# Patient Record
Sex: Male | Born: 1995 | Race: White | Hispanic: No | Marital: Single | Smoking: Current some day smoker
Health system: Southern US, Community
[De-identification: ages and names within clinical notes are randomized; demographics above are authoritative.]

---
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
## 2018-03-09 ENCOUNTER — Emergency Department (HOSPITAL_COMMUNITY)
Admission: EM | Admit: 2018-03-09 | Discharge: 2018-03-09 | Disposition: A | Discharge status: Home / Self Care | Payer: 59 | Attending: Emergency Medicine | Admitting: Emergency Medicine

## 2018-03-09 ENCOUNTER — Other Ambulatory Visit: Payer: Self-pay

## 2018-03-09 ENCOUNTER — Encounter (HOSPITAL_COMMUNITY): Payer: Self-pay | Admitting: Emergency Medicine

## 2018-03-09 DIAGNOSIS — Z23 Encounter for immunization: Secondary | ICD-10-CM | POA: Diagnosis not present

## 2018-03-09 DIAGNOSIS — W450XXA Nail entering through skin, initial encounter: Secondary | ICD-10-CM | POA: Diagnosis not present

## 2018-03-09 DIAGNOSIS — R0602 Shortness of breath: Secondary | ICD-10-CM | POA: Diagnosis not present

## 2018-03-09 DIAGNOSIS — F17200 Nicotine dependence, unspecified, uncomplicated: Secondary | ICD-10-CM | POA: Diagnosis not present

## 2018-03-09 DIAGNOSIS — Y9289 Other specified places as the place of occurrence of the external cause: Secondary | ICD-10-CM | POA: Diagnosis not present

## 2018-03-09 DIAGNOSIS — R253 Fasciculation: Secondary | ICD-10-CM

## 2018-03-09 DIAGNOSIS — T148XXA Other injury of unspecified body region, initial encounter: Secondary | ICD-10-CM

## 2018-03-09 DIAGNOSIS — S91331A Puncture wound without foreign body, right foot, initial encounter: Secondary | ICD-10-CM | POA: Insufficient documentation

## 2018-03-09 DIAGNOSIS — Y999 Unspecified external cause status: Secondary | ICD-10-CM | POA: Insufficient documentation

## 2018-03-09 DIAGNOSIS — R252 Cramp and spasm: Secondary | ICD-10-CM | POA: Insufficient documentation

## 2018-03-09 DIAGNOSIS — Y9389 Activity, other specified: Secondary | ICD-10-CM | POA: Diagnosis not present

## 2018-03-09 DIAGNOSIS — S99921A Unspecified injury of right foot, initial encounter: Secondary | ICD-10-CM | POA: Diagnosis present

## 2018-03-09 MED ORDER — TETANUS-DIPHTH-ACELL PERTUSSIS 5-2.5-18.5 LF-MCG/0.5 IM SUSP
0.5000 mL | Freq: Once | INTRAMUSCULAR | Status: AC
Start: 2018-03-09 — End: 2018-03-09
  Administered 2018-03-09: 0.5 mL via INTRAMUSCULAR
  Filled 2018-03-09: qty 0.5

## 2018-03-09 MED ORDER — BACITRACIN ZINC 500 UNIT/GM EX OINT: 1.0000 "application " | TOPICAL_OINTMENT | Freq: Two times a day (BID) | CUTANEOUS | 0 refills | Status: AC

## 2018-03-09 NOTE — ED Notes (Signed)
PT reports he stepped on a rusty nail yesterday, very minimal and superficial spot noted to bottom of R foot. Area is scabbed over. Pt does not know when last tetanus shot was, received vaccinations prior to going to college and it could have been one of those.

## 2018-03-09 NOTE — ED Provider Notes (Signed)
[facility] EMERGENCY DEPARTMENT Provider Note   CSN: [id number] Arrival date & time: 03/09/18  1918     History   Chief Complaint Chief Complaint  Patient presents with  . Leg Pain    HPI Cody May is a 22 y.o. male with no significant past medical history presents today for evaluation of acute onset of wound to the plantar aspect of the right foot.  Patient states that yesterday at around 11 PM he was outside on a deck when he stepped on a portion of the deck that had a rusty nail exposed.  He states that the nail went through his shoe and resulted in a superficial injury to the plantar aspect of his right foot.  He denies any active bleeding.  He states that since then he has felt intermittent "twitching" of his upper and lower extremities.  He states that he felt that it was a little bit difficult to talk today and that he could not move his jaw as much as normal.  Denies drooling.  Does note intermittent shortness of breath but is unsure of when this comes about.  Also felt as though his heart rate was elevated sometimes.  He states that he "did some Googling" and is concerned for tetanus.  Unsure when his last tetanus vaccination was.  Denies fevers, chills, or chest pain.  Mother notes that they did spend all day yesterday moving, spending time outside and carrying heavy objects and that the patient may be somewhat dehydrated.  The history is provided by the patient and a parent.    History reviewed. No pertinent past medical history.  There are no active problems to display for this patient.   History reviewed. No pertinent surgical history.      Home Medications    Prior to Admission medications   Medication Sig Start Date End Date Taking? Authorizing Provider  bacitracin ointment Apply 1 application topically 2 (two) times daily. 03/09/18   Jeanie Sewer, PA-C    Family History No family history on file.  Social History Social History   Tobacco Use  .  Smoking status: Current Some Day Smoker  . Smokeless tobacco: Never Used  Substance Use Topics  . Alcohol use: Yes  . Drug use: Yes    Types: Marijuana     Allergies   Patient has no allergy information on record.   Review of Systems Review of Systems  Constitutional: Negative for fever.  HENT: Negative for drooling and sore throat.   Respiratory: Positive for shortness of breath.   Cardiovascular: Negative for chest pain.  Musculoskeletal: Positive for myalgias. Negative for neck pain and neck stiffness.  Skin: Positive for wound.  Neurological: Negative for syncope, weakness and numbness.  All other systems reviewed and are negative.    Physical Exam Updated Vital Signs BP 140/63 (BP Location: Right Arm)   Pulse (!) 111   Temp 98.4 F (36.9 C) (Oral)   Resp 16   Ht 5\' 9"  (1.753 m)   Wt 68 kg (150 lb)   SpO2 98%   BMI 22.15 kg/m   Physical Exam  Constitutional: He is oriented to person, place, and time. He appears well-developed and well-nourished. No distress.  HENT:  Head: Normocephalic and atraumatic.  Tolerating secretions without difficulty.  No trismus or sublingual abnormalities.  No tonsillar hypertrophy, exudates, or uvular deviation.  No upper airway stridor.  Eyes: Conjunctivae are normal. Right eye exhibits no discharge. Left eye exhibits no discharge.  Neck:  Normal range of motion. Neck supple. No JVD present. No tracheal deviation present.  No difficulty swallowing.  Cardiovascular: Normal rate.  Pulmonary/Chest: Effort normal.  Abdominal: He exhibits no distension.  Musculoskeletal: Normal range of motion. He exhibits no edema or tenderness.  No midline spine TTP, no paraspinal muscle tenderness, no deformity, crepitus, or step-off noted. moves extremities spontaneously with normal range of motion.  5/5 strength of BUE and BLE major muscle groups.  Lymphadenopathy:    He has no cervical adenopathy.  Neurological: He is alert and oriented to  person, place, and time. No cranial nerve deficit or sensory deficit. He exhibits normal muscle tone.  Mental Status:  Alert, thought content appropriate, able to give a coherent history. Speech fluent without evidence of aphasia. Able to follow 2 step commands without difficulty.  Cranial Nerves:  II:  Peripheral visual fields grossly normal, pupils equal, round, reactive to light III,IV, VI: ptosis not present, extra-ocular motions intact bilaterally  V,VII: smile symmetric, facial light touch sensation equal VIII: hearing grossly normal to voice  X: uvula elevates symmetrically  XI: bilateral shoulder shrug symmetric and strong XII: midline tongue extension without fassiculations Motor:  Normal tone. 5/5 strength of BUE and BLE major muscle groups including strong and equal grip strength and dorsiflexion/plantar flexion Sensory: light touch normal in all extremities. Gait: normal gait and balance. Able to walk on toes and heels with ease.     Skin: Skin is warm and dry. No erythema.  Pinpoint scab <28mm to the plantar aspect of the right foot.  Nontender to palpation.  No surrounding erythema, induration, or fluctuance.  Psychiatric: He has a normal mood and affect. His behavior is normal.  Nursing note and vitals reviewed.    ED Treatments / Results  Labs (all labs ordered are listed, but only abnormal results are displayed) Labs Reviewed - No data to display  EKG None  Radiology No results found.  Procedures Procedures (including critical care time)  Medications Ordered in ED Medications  Tdap (BOOSTRIX) injection 0.5 mL (has no administration in time range)     Initial Impression / Assessment and Plan / ED Course  I have reviewed the triage vital signs and the nursing notes.  Pertinent labs & imaging results that were available during my care of the patient were reviewed by me and considered in my medical decision making (see chart for details).     Patient  presents for evaluation of puncture wound to the plantar aspect of the right foot which occurred yesterday at around 11 PM.  He is afebrile, initially somewhat tachycardic but with resolution on reevaluation.  He does state that he felt quite anxious at the thought that he could possibly have tetanus.  I splinted patient of the likelihood of him developing tetanus less than 24 hours after exposure to a rusty nail is highly unlikely as the incubation period is typically as little as 3 days or as long as 21 days, average 8 days.  He has no trismus, tolerating secretions without difficulty.  No evidence of flaccid or spastic paralysis.  He is neurovascularly intact, ambulatory without difficulty.  His wound appears to be quite superficial, no signs of secondary skin infection.  He did also spend the day yesterday moving to a new home, possibly mildly dehydrated.  I doubt gross electrolyte abnormalities.  We will update his tetanus as he is unsure of the last time he received his tetanus vaccine.  Discussed wound care.  Discussed strict ED  return precautions.  Patient and patient's mother verbalized understanding of and agreement with plan and patient is stable for discharge home at this time.  Final Clinical Impressions(s) / ED Diagnoses   Final diagnoses:  Puncture wound  Muscle twitching    ED Discharge Orders        Ordered    bacitracin ointment  2 times daily     03/09/18 2052       Bennye AlmFawze, Celeste Candelas A, PA-C 03/09/18 2135    Raeford RazorKohut, Stephen, MD 03/09/18 2358

## 2018-03-09 NOTE — ED Triage Notes (Signed)
Pt sent by pcp to rule out tetanus. Pt stepped on rusty nail yesterday and now c/o bilateral leg spasms and states it feels hard to swallow at times.

## 2018-03-09 NOTE — Discharge Instructions (Signed)
Apply antibiotic ointment twice daily. Follow-up with PCP if symptoms persist. Return to the emergency department immediately for any concerning signs or symptoms develop such as high fevers, redness of the extremities or swelling, lockjaw, or muscle spasms.

## 2018-08-18 DIAGNOSIS — R209 Unspecified disturbances of skin sensation: Secondary | ICD-10-CM | POA: Diagnosis not present

## 2018-09-15 ENCOUNTER — Other Ambulatory Visit: Payer: Self-pay | Admitting: Internal Medicine

## 2018-09-15 DIAGNOSIS — R200 Anesthesia of skin: Secondary | ICD-10-CM

## 2018-09-15 DIAGNOSIS — Z1389 Encounter for screening for other disorder: Secondary | ICD-10-CM | POA: Diagnosis not present

## 2018-09-15 DIAGNOSIS — R209 Unspecified disturbances of skin sensation: Secondary | ICD-10-CM | POA: Diagnosis not present

## 2018-09-15 DIAGNOSIS — J302 Other seasonal allergic rhinitis: Secondary | ICD-10-CM | POA: Diagnosis not present

## 2018-09-15 DIAGNOSIS — G43909 Migraine, unspecified, not intractable, without status migrainosus: Secondary | ICD-10-CM | POA: Diagnosis not present

## 2018-09-19 ENCOUNTER — Ambulatory Visit
Admission: RE | Admit: 2018-09-19 | Discharge: 2018-09-19 | Disposition: A | Discharge status: Home / Self Care | Payer: BLUE CROSS/BLUE SHIELD | Source: Ambulatory Visit | Attending: Internal Medicine | Admitting: Internal Medicine

## 2018-09-19 DIAGNOSIS — R200 Anesthesia of skin: Secondary | ICD-10-CM

## 2018-12-12 DIAGNOSIS — R0789 Other chest pain: Secondary | ICD-10-CM | POA: Diagnosis not present

## 2018-12-12 DIAGNOSIS — K219 Gastro-esophageal reflux disease without esophagitis: Secondary | ICD-10-CM | POA: Diagnosis not present

## 2018-12-12 DIAGNOSIS — M545 Low back pain: Secondary | ICD-10-CM | POA: Diagnosis not present

## 2020-08-23 IMAGING — CT CT HEAD W/O CM
3 of 4 series · 16 of 47 positions shown, 19 images · non-contrast
Comparison: None.

CLINICAL DATA: Headache with right-sided numbness for several weeks

EXAM:
CT HEAD WITHOUT CONTRAST
TECHNIQUE: Contiguous axial images were obtained from the base of the skull
through the vertex without intravenous contrast.

[Series 2: head 5.00 hr40 s3 ibhc · axial · 0.47mm/px · z∈[-621,-471]mm · 10 of 36 slices shown, 13 images]
[im 3/36  brain]
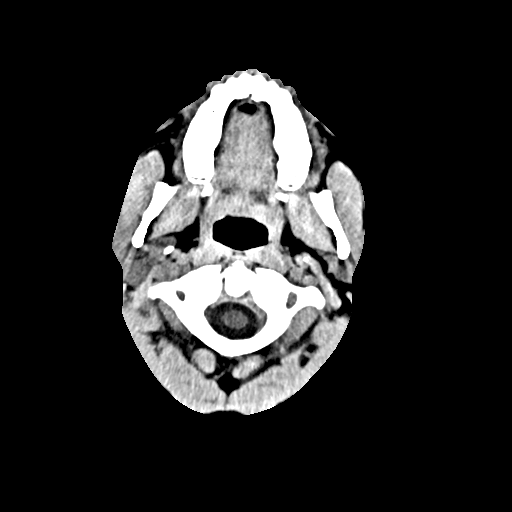
[im 3/36  bone]
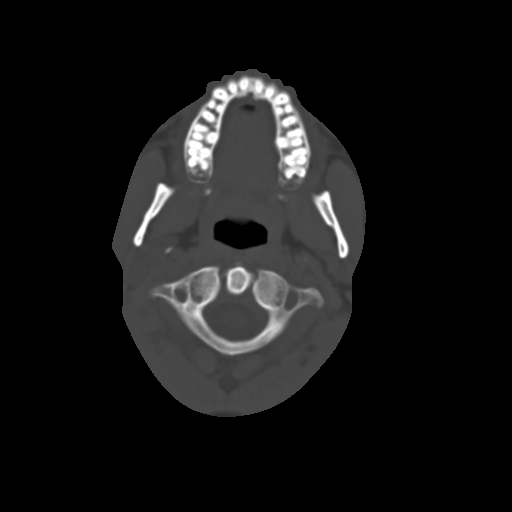
[im 6/36  brain]
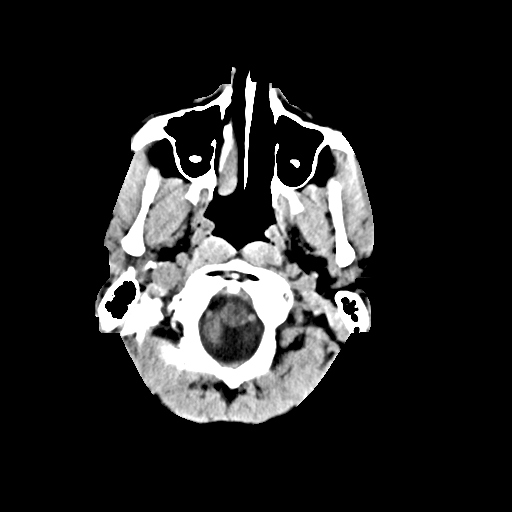
[im 11/36  brain]
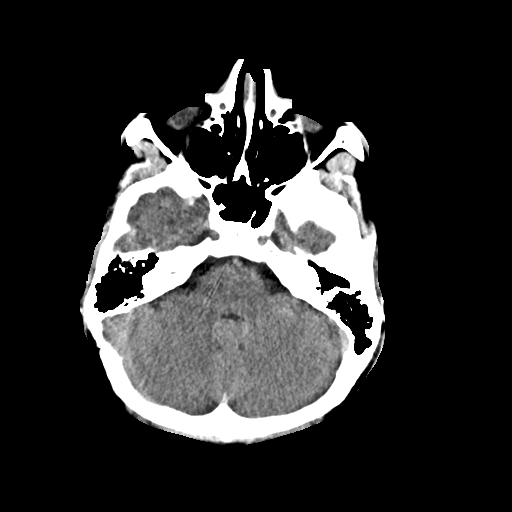
[im 13/36  brain]
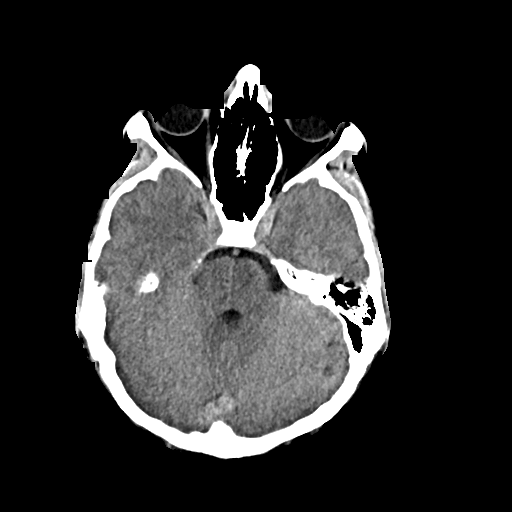
[im 16/36  brain]
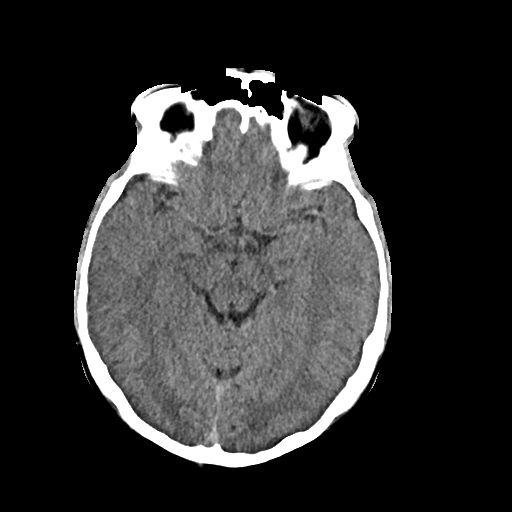
[im 16/36  bone]
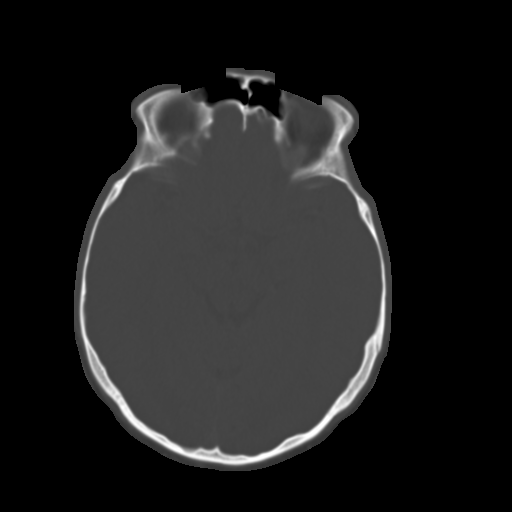
[im 21/36  brain]
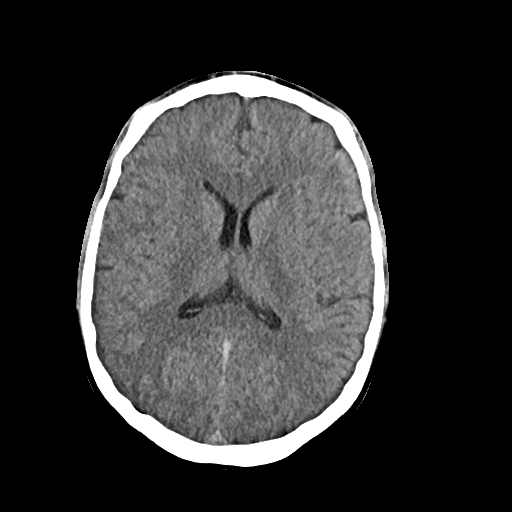
[im 23/36  brain]
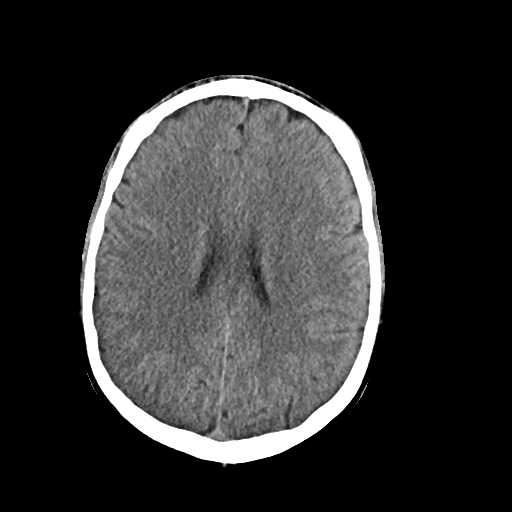
[im 26/36  brain]
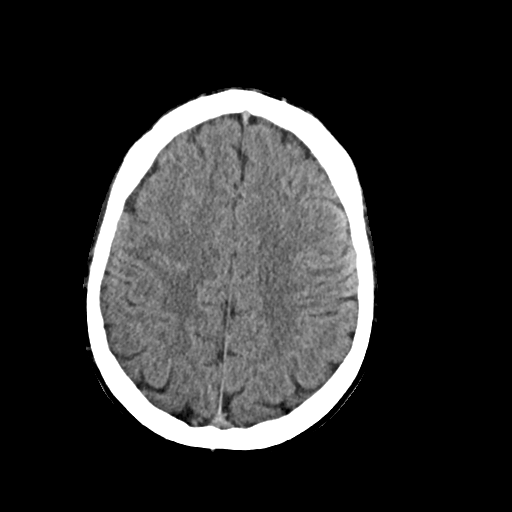
[im 31/36  brain]
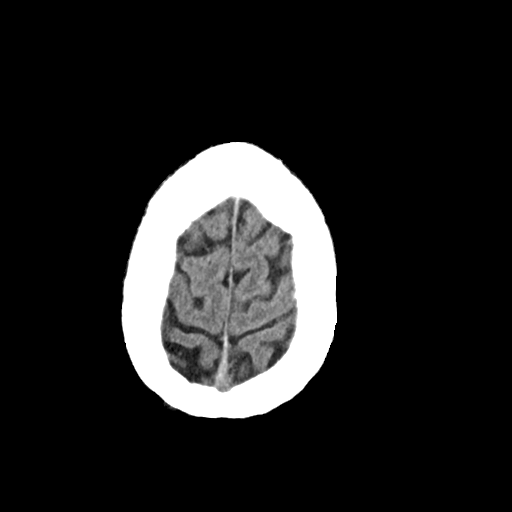
[im 31/36  bone]
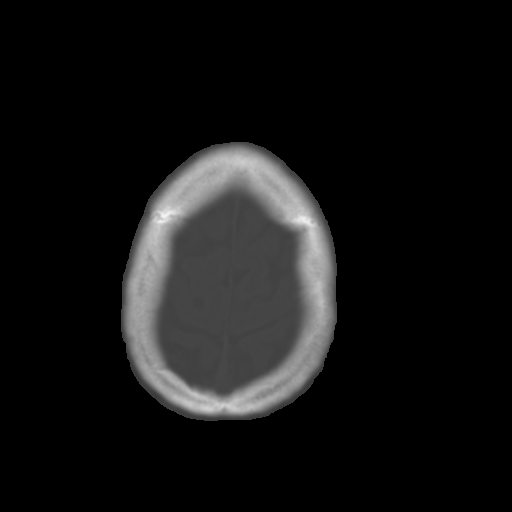
[im 33/36  brain]
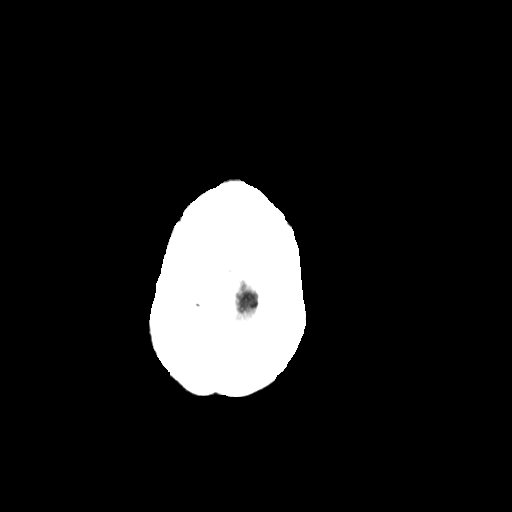

[Series 4: head 3.00 hr40 s3 sag · sagittal · 0.37mm/px · 3 of 59 slices shown]
[im 20/59  brain]
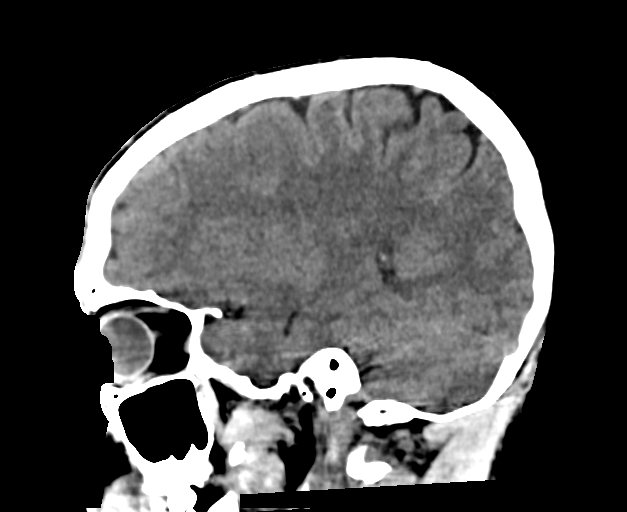
[im 30/59  brain]
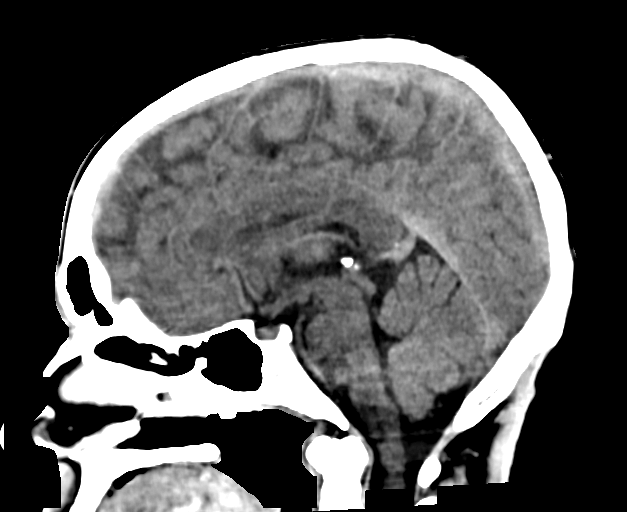
[im 39/59  brain]
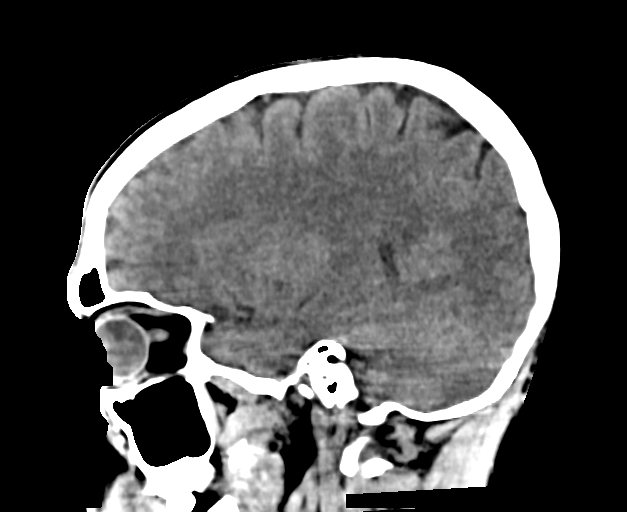

[Series 6: head 3.00 hr40 s3 cor · coronal · 0.34mm/px · 3 of 76 slices shown]
[im 26/76  brain]
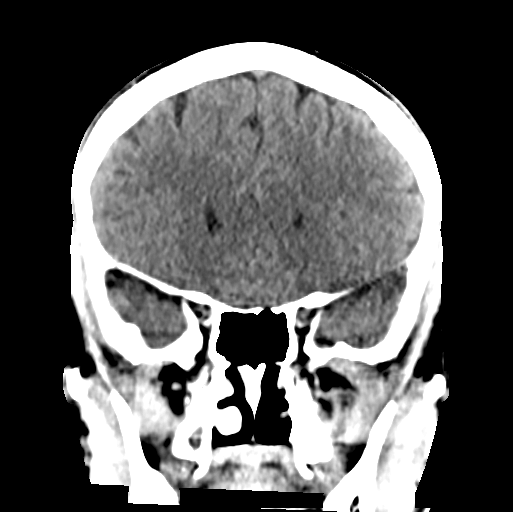
[im 34/76  brain]
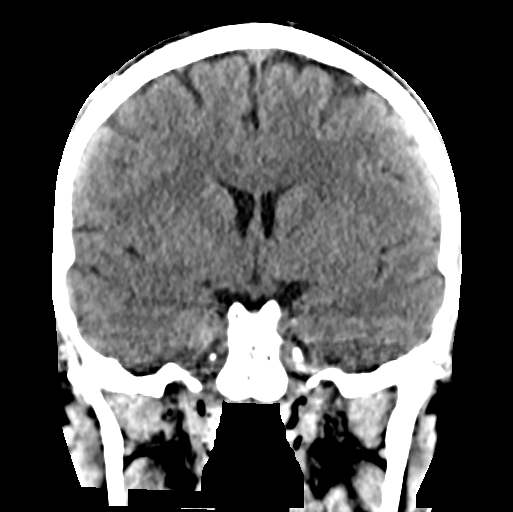
[im 42/76  brain]
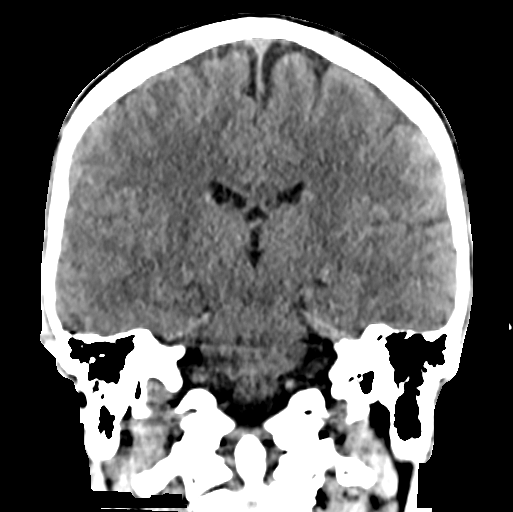

[16 of 47 positions shown; findings below may reference images not displayed]

FINDINGS: Brain: The ventricles are normal in size and configuration. There is
no intracranial mass, hemorrhage, extra-axial fluid collection, or
midline shift. Brain parenchyma appears normal. No acute infarct
evident.

Vascular: No hyperdense vessel. No appreciable vascular
calcification evident.

Skull: The bony calvarium appears intact.

Sinuses/Orbits: Paranasal sinuses are clear. Orbits appear symmetric
bilaterally.

Other: Mastoid air cells are clear. There is debris in each external
auditory canal.
IMPRESSION: Probable cerumen in each external auditory canal. Study otherwise
unremarkable.

## 2024-09-26 NOTE — Progress Notes (Signed)
 Subjective  Previsit planning was completed via snapshot and review of chart.   Cody May is a 29 y.o. male who presents for Ear Problem (Pt states that he is having trouble hearing out of his left ear, started about 3 hours ago, slight trouble for about the past week. Put ear wax removal drops in and used a qtip to attempt to remove ear wax )   History of Present Illness This is a 29 year old male presenting with left ear hearing loss.  He has been experiencing mild tinnitus in his left ear for the past week, which he attributes to his noisy work environment. Earlier today, he noticed a significant decrease in his hearing ability in the same ear which has worsened since he tried over-the-counter ear drops and Q-tips for treatment.Cody May He reports no ear drainage, sore throat, fever, or body aches.  No cough, dizziness, respiratory symptoms.  He also does not experience any pain but mentions a slight sensation of pressure in the affected ear. This is his first encounter with such symptoms. He has not attempted any self-treatment with ear drops and does not use Q-tips for ear cleaning.  Medical History[1]  Allergies[2]   Surgical History[3]  Family History[4]  Social History   Socioeconomic History   Marital status: Single    Spouse name: Not on file   Number of children: Not on file   Years of education: Not on file   Highest education level: Not on file  Occupational History   Not on file  Tobacco Use   Smoking status: Never   Smokeless tobacco: Never  Vaping Use   Vaping status: Every Day   Substances: Nicotine, THC, Flavoring  Substance and Sexual Activity   Alcohol use: Not on file   Drug use: Never   Sexual activity: Not on file  Other Topics Concern   Not on file  Social History Narrative   Not on file   Social Drivers of Health   Living Situation: Not on file  Food Insecurity: Not on file  Transportation Needs: Not on file  Utilities: Not  on file  Safety: Not on file  Alcohol Screening: Not on file  Tobacco Use: Low Risk (09/26/2024)   Patient History    Smoking Tobacco Use: Never    Smokeless Tobacco Use: Never    Passive Exposure: Not on file  Depression: Not At Risk (09/26/2024)   PHQ-2    PHQ-2 Score: 2  Social Connections: Not on file  Financial Resource Strain: Not on file     Encounter Medications[5]   Review of Systems - All systems reviewed and are negative except what is noted in the HPI.  Objective  Blood pressure 151/78, pulse 67, temperature 96.2 F (35.7 C), temperature source Tympanic, resp. rate 18, height 1.753 m (5' 9), weight 77.1 kg (170 lb), SpO2 99%. Physical Exam Chaperone: Vernell ENT: Impacted wax in the left ear canal, obstructing the view of the eardrum. Tongue appears normal. Physical Exam Vitals and nursing note reviewed.  Constitutional:      General: He is not in acute distress.    Appearance: Normal appearance. He is not ill-appearing or toxic-appearing.  HENT:     Head: Normocephalic and atraumatic.     Right Ear: Tympanic membrane, ear canal and external ear normal. There is no impacted cerumen.     Left Ear: External ear normal. There is impacted cerumen.     Nose: Nose normal. No congestion or rhinorrhea.  Mouth/Throat:     Mouth: Mucous membranes are moist.     Pharynx: Oropharynx is clear.  Eyes:     Conjunctiva/sclera: Conjunctivae normal.     Pupils: Pupils are equal, round, and reactive to light.  Cardiovascular:     Rate and Rhythm: Normal rate and regular rhythm.     Pulses: Normal pulses.     Heart sounds: Normal heart sounds.  Pulmonary:     Effort: Pulmonary effort is normal. No respiratory distress.  Abdominal:     General: There is no distension.     Palpations: Abdomen is soft.  Musculoskeletal:        General: Normal range of motion.     Cervical back: Normal range of motion and neck supple. No tenderness.  Lymphadenopathy:     Cervical: No  cervical adenopathy.  Skin:    General: Skin is warm and dry.     Capillary Refill: Capillary refill takes less than 2 seconds.     Findings: No rash.  Neurological:     General: No focal deficit present.     Mental Status: He is alert and oriented to person, place, and time.  Psychiatric:        Mood and Affect: Mood normal.        Behavior: Behavior normal.     Results  No results found for this or any previous visit (from the past 24 hours).  Imaging result: NA If imaging obtained during visit, radiologist will also review.  No orders to display     Assessment/Plan   1. Impacted cerumen, left ear  Ear Cerumen Removal    2. Hearing loss of left ear due to cerumen impaction  Ear Cerumen Removal      Assessment & Plan Initial Assessment: 29 year old male with slight tinnitus and hearing loss in the left ear for the past week. No associated pain, drainage, sore throat, fever, or body aches. Examination revealed impacted wax against the eardrum.  Differential Diagnosis: - Impacted earwax: Examination revealed impacted wax against the eardrum. Attempt to remove wax. If unsuccessful, prescribe softening drops. Differentials also include, but are not limited to foreign body in the ear canal, otitis media, serous otitis media, cerumen impaction, otitis externa, neurologic hearing loss.  Urgent Care Course: - Examination revealed impacted wax against the left eardrum. Cerumen removal ordered  Final Assessment: Cerumen impaction left ear with acute loss of hearing since adding eardrops and using a Q-tip earlier today  Clinical Impression: - Impacted earwax with successful disimpaction and restoration of hearing  Disposition: - Follow-Up: Return if symptoms recur  Patient Education: Cerumen impaction information given    Medical records from outside the [facility] system also reviewed during today's clinical visit.    Findings and plan of care discussed with the  patient/family members.  Lab study results, if obtained today, discussed with patient/family.   Written and verbal instructions given for pt's diagnosis/es as well as recommendations for symptom relief and follow up. If pt is having pain and no contraindications, advised taking OTC acetaminophen and/or ibuprofen as needed.  Increase fluid intake if directed for hydration. I reviewed the patient instructions on the AVS with the patient/family who convey/s understanding and is/are in agreement with plan of care. The patient/family voice/s understanding of all prescriptions, if written. No barriers to adherence were noted and they convey understanding.  Plan for follow-up as discussed or as needed if any worsening symptoms or change in condition. If chronic conditions noted, the pt  is advised to follow up with their PCP. Strict ED precautions given and discussed. All Questions are answered and the patient/family conveys understanding.  After Visit Summary was given to patient and/or sent to MyChart.   Cody May   [employer] was used to create visit note. Consent from the patient/caregiver was obtained prior to its use.        [1] History reviewed. No pertinent past medical history. [2] No Known Allergies [3] History reviewed. No pertinent surgical history. [4] No family history on file. [5] No outpatient encounter medications on file as of 09/26/2024.   No facility-administered encounter medications on file as of 09/26/2024.

## 2024-10-01 ENCOUNTER — Other Ambulatory Visit: Payer: Self-pay

## 2024-10-01 ENCOUNTER — Emergency Department (HOSPITAL_COMMUNITY)
Admission: EM | Admit: 2024-10-01 | Discharge: 2024-10-02 | Disposition: A | Discharge status: Home / Self Care | Attending: Emergency Medicine | Admitting: Emergency Medicine

## 2024-10-01 ENCOUNTER — Encounter (HOSPITAL_COMMUNITY): Payer: Self-pay | Admitting: Emergency Medicine

## 2024-10-01 ENCOUNTER — Emergency Department (HOSPITAL_COMMUNITY)

## 2024-10-01 DIAGNOSIS — R0789 Other chest pain: Secondary | ICD-10-CM | POA: Insufficient documentation

## 2024-10-01 DIAGNOSIS — R0602 Shortness of breath: Secondary | ICD-10-CM | POA: Insufficient documentation

## 2024-10-01 DIAGNOSIS — R079 Chest pain, unspecified: Secondary | ICD-10-CM

## 2024-10-01 NOTE — ED Triage Notes (Signed)
 Pt c/o sudden onset of SOB and chest tightness while sitting at home watching TV. The pain radiates down the L side and is worse while sitting down.

## 2024-10-01 NOTE — ED Triage Notes (Signed)
 Pt arrives with complaints of feeling shob and right sided chest tightness that started about 20 min prior to arrival. Denies other sx

## 2024-10-02 LAB — TROPONIN T, HIGH SENSITIVITY: Troponin T High Sensitivity: <6 ng/L (ref 0–19)

## 2024-10-02 LAB — CBC WITH DIFFERENTIAL/PLATELET
Abs Immature Granulocytes: 0.04 K/uL (ref 0.00–0.07)
Basophils Absolute: 0.1 K/uL (ref 0.0–0.1)
Basophils Relative: 1 %
Eosinophils Absolute: 0.3 K/uL (ref 0.0–0.5)
Eosinophils Relative: 3 %
HCT: 45.1 % (ref 39.0–52.0)
Hemoglobin: 15.5 g/dL (ref 13.0–17.0)
Immature Granulocytes: 0 %
Lymphocytes Relative: 17 %
Lymphs Abs: 1.8 K/uL (ref 0.7–4.0)
MCH: 31.1 pg (ref 26.0–34.0)
MCHC: 34.4 g/dL (ref 30.0–36.0)
MCV: 90.4 fL (ref 80.0–100.0)
Monocytes Absolute: 0.8 K/uL (ref 0.1–1.0)
Monocytes Relative: 7 %
Neutro Abs: 8 K/uL — ABNORMAL HIGH (ref 1.7–7.7)
Neutrophils Relative %: 72 %
Platelets: 261 K/uL (ref 150–400)
RBC: 4.99 MIL/uL (ref 4.22–5.81)
RDW: 13 % (ref 11.5–15.5)
WBC: 10.9 K/uL — ABNORMAL HIGH (ref 4.0–10.5)
nRBC: 0 % (ref 0.0–0.2)

## 2024-10-02 LAB — BASIC METABOLIC PANEL WITH GFR
Anion gap: 10 (ref 5–15)
BUN: 12 mg/dL (ref 6–20)
CO2: 26 mmol/L (ref 22–32)
Calcium: 9.5 mg/dL (ref 8.9–10.3)
Chloride: 102 mmol/L (ref 98–111)
Creatinine, Ser: 0.88 mg/dL (ref 0.61–1.24)
GFR, Estimated: >60 mL/min
Glucose, Bld: 111 mg/dL — ABNORMAL HIGH (ref 70–99)
Potassium: 4.3 mmol/L (ref 3.5–5.1)
Sodium: 138 mmol/L (ref 135–145)

## 2024-10-02 LAB — D-DIMER, QUANTITATIVE: D-Dimer, Quant: <0.27 ug{FEU}/mL (ref 0.00–0.50)

## 2024-10-02 MED ORDER — ORPHENADRINE CITRATE ER 100 MG PO TB12: 100.0000 mg | ORAL_TABLET | Freq: Two times a day (BID) | ORAL | 0 refills | Status: AC

## 2024-10-02 NOTE — ED Provider Notes (Signed)
 " [place] EMERGENCY DEPARTMENT AT [facility] Provider Note   CSN: [id number] Arrival date & time: 10/01/24  2202     Patient presents with: Shortness of Breath   Cody May is a 29 y.o. male.   29 yo male presents with complaint of pain on the left side of the chest, sudden onset tonight when he moved his left arm. Associated with SHOB. Pain radiated to neck with throbbing feeling in his neck and extended up to head. Lasted about an hour and improved although with some persistent left side chest discomfort.  Vapes, no significant medical history otherwise.  Dad with MI in his 12s.       Prior to Admission medications  Medication Sig Start Date End Date Taking? Authorizing Provider  orphenadrine (NORFLEX) 100 MG tablet Take 1 tablet (100 mg total) by mouth 2 (two) times daily for 10 days. 10/02/24 10/12/24 Yes Beverley Leita LABOR, PA-C  bacitracin  ointment Apply 1 application topically 2 (two) times daily. 03/09/18   Fawze, Mina A, PA-C    Allergies: Patient has no known allergies.    Review of Systems Negative except as per HPI Updated Vital Signs BP 119/78 (BP Location: Right Arm)   Pulse 60   Temp 97.8 F (36.6 C)   Resp 16   Ht 5' 9 (1.753 m)   Wt 83.9 kg   SpO2 100%   BMI 27.32 kg/m   Physical Exam Vitals and nursing note reviewed.  Constitutional:      General: He is not in acute distress.    Appearance: He is well-developed. He is not diaphoretic.  HENT:     Head: Normocephalic and atraumatic.  Cardiovascular:     Rate and Rhythm: Normal rate and regular rhythm.     Pulses: Normal pulses.  Pulmonary:     Effort: Pulmonary effort is normal.     Breath sounds: Normal breath sounds. No decreased breath sounds.  Chest:     Chest wall: No tenderness or crepitus.  Musculoskeletal:     Cervical back: Neck supple.     Right lower leg: No tenderness. No edema.     Left lower leg: No tenderness. No edema.  Skin:    General: Skin is warm and dry.      Findings: No erythema.  Neurological:     Mental Status: He is alert and oriented to person, place, and time.  Psychiatric:        Behavior: Behavior normal.     (all labs ordered are listed, but only abnormal results are displayed) Labs Reviewed  CBC WITH DIFFERENTIAL/PLATELET - Abnormal; Notable for the following components:      Result Value   WBC 10.9 (*)    Neutro Abs 8.0 (*)    All other components within normal limits  BASIC METABOLIC PANEL WITH GFR - Abnormal; Notable for the following components:   Glucose, Bld 111 (*)    All other components within normal limits  D-DIMER, QUANTITATIVE  TROPONIN T, HIGH SENSITIVITY    EKG: EKG Interpretation Date/Time:  Friday October 02 2024 03:55:12 EST Ventricular Rate:  60 PR Interval:  146 QRS Duration:  102 QT Interval:  414 QTC Calculation: 414 R Axis:   94  Text Interpretation: Normal sinus rhythm with sinus arrhythmia Rightward axis Borderline ECG No previous ECGs available Confirmed by Raford Lenis ([id number]) on 10/02/2024 4:16:03 AM  Radiology: DG Chest 2 View Result Date: 10/01/2024 EXAM: 2 VIEW(S) XRAY OF THE CHEST 10/01/2024 10:48:00  PM COMPARISON: None available. CLINICAL HISTORY: Shortness of breath. FINDINGS: LUNGS AND PLEURA: No focal pulmonary opacity. No pleural effusion. No pneumothorax. HEART AND MEDIASTINUM: No acute abnormality of the cardiac and mediastinal silhouettes. BONES AND SOFT TISSUES: No acute osseous abnormality. IMPRESSION: 1. No acute process. Electronically signed by: Greig Pique MD 10/01/2024 10:52 PM EST RP Workstation: [id number]     Procedures   Medications Ordered in the ED - No data to display                                  Medical Decision Making Amount and/or Complexity of Data Reviewed Labs: ordered. Radiology: ordered.   This patient presents to the ED for concern of SHOB, CP, this involves an extensive number of treatment options, and is a complaint that carries with it a  high risk of complications and morbidity.  The differential diagnosis includes but not limited to pneumothorax, PE, ACS, arrhythmia    Co morbidities / Chronic conditions that complicate the patient evaluation  Vapes, otherwise healthy    Additional history obtained:  Additional history obtained from EMR External records from outside source obtained and reviewed including no prior labs for comparison    Lab Tests:  I Ordered, and personally interpreted labs.  The pertinent results include:  dimer negative. CBC without significant findings. BMP without significant findings. Trop negative.    Imaging Studies ordered:  I ordered imaging studies including CXR  I independently visualized and interpreted imaging which showed no acute process  I agree with the radiologist interpretation   Cardiac Monitoring: / EKG:  The patient was maintained on a cardiac monitor.  I personally viewed and interpreted the cardiac monitored which showed an underlying rhythm of: NSR, rate 60   Problem List / ED Course / Critical interventions / Medication management  29 yo male with SHOB and CP as above, improved since onset although still somewhat present. Not reproduced with palpation, pulses equal in all 4 extremities. Work up unrevealing. Recommend return as needed, recheck with PCP. Can try muscle relaxant in the event of strained muscle.  I have reviewed the patients home medicines and have made adjustments as needed   Consultations Obtained:  I requested consultation with the ER attending, Dr. Raford,  and discussed lab and imaging findings as well as pertinent plan - they recommend: Initial and repeat EKG reviewed, agrees with plan of care   Social Determinants of Health:  No PCP on file, referred to [facility]    Test / Admission - Considered:  Stable for dc      Final diagnoses:  Chest pain, unspecified type  Shortness of breath    ED Discharge Orders           Ordered    orphenadrine (NORFLEX) 100 MG tablet  2 times daily        10/02/24 0505               Beverley Leita LABOR, PA-C 10/02/24 [id number]    Raford Lenis, MD 10/02/24 0725  "

## 2024-10-02 NOTE — ED Notes (Signed)
 Repeat EKG was done.

## 2024-10-02 NOTE — Discharge Instructions (Addendum)
 Work up today is reassuring. Return to the ER for any concerning symptoms. Follow up with primary care for recheck. If you do not have a primary care, please call [facility].   Can try muscle relaxant, do not take if driving.
